# Patient Record
Sex: Male | Born: 1995 | Race: Asian | Hispanic: No | Marital: Married | Smoking: Former smoker
Health system: Southern US, Community
[De-identification: ages and names within clinical notes are randomized; demographics above are authoritative.]

---

## 2020-09-27 ENCOUNTER — Ambulatory Visit (INDEPENDENT_AMBULATORY_CARE_PROVIDER_SITE_OTHER): Payer: 59 | Admitting: Student

## 2020-09-27 ENCOUNTER — Encounter: Payer: Self-pay | Admitting: Student

## 2020-09-27 VITALS — BP 139/93 | HR 82 | Temp 98.1°F | Ht 67.0 in | Wt 195.6 lb

## 2020-09-27 DIAGNOSIS — G44211 Episodic tension-type headache, intractable: Secondary | ICD-10-CM

## 2020-09-27 DIAGNOSIS — G44219 Episodic tension-type headache, not intractable: Secondary | ICD-10-CM | POA: Diagnosis not present

## 2020-09-27 NOTE — Assessment & Plan Note (Addendum)
Patient presents today endorsing worsening headaches over the last month. He describes these headaches as throbbing, usually lasting around 30 minutes. Notes the pain does not occur in one specific spot, but around his head. He reports he has experienced these headaches intermittently for the past six years, usually occurring only ~2 times per week. He states they have now occured up to 3-4 times per day over the last month. Mentions he takes Advil to alleviate the pain, usually up to 3-4 times daily. He has taken Motrin and Tylenol as well during this time. He previously was only taking the medication only 1-2 times daily. He does mention the headaches are sometimes associated with dizziness. Otherwise, patient denies aura, blurry vision, floaters, photophobia, epiphora, rhinorrhea, chest pain, dyspnea, palpitations, syncope, abdominal pain, nausea, vomiting, fevers, extremity weakness. He states he snores intermittently, denies daytime sleepiness or frequent nighttime wakening. Of note, he and his wife recently had a baby on November 5th of this year. He states he has been able to get 7-8 hours asleep and keeps himself hydrated. He works at a [employer] features editor.   A/P: On exam, BP 132/81, repeat 139/81. No focal neurological deficit. History most consistent with tension-type headaches. No red flag symptoms. Believe these headaches are likely due to overuse of medication. Differential also includes hypertension, stress-induced, noise-induced. Given his age and frequency of headaches, will obtain imaging to rule out structural abnormalities. Plan on BMP today given frequent use of NSAIDs. Discussed with patient to gradually decrease medication use over the next two weeks. If decreasing medication does not alleviate the headaches, will refer to headache clinic for further evaluation. - Decrease NSAID use - BMP today - MRI brain ordered - F/u in two weeks, can refer to HA clinic if no relief  ADDENDUM: Unable  to reach patient by telephone today (12/14). Given his frequency of headaches, will go ahead and place referral for the headache clinic. Will attempt to reach patient again.

## 2020-09-27 NOTE — Patient Instructions (Signed)
Mr. Jeremy Petersen,  It was a pleasure seeing you today!  We discussed your headaches today. Your headaches are typical of a tension headache. We believe the frequency of your headaches is due to the medication you have been taking. We would like for you to gradually decrease the amount of Advil you are taking over the next two weeks. We are also going to get imaging of your head to rule out any structural problems. I would like to see you back in two weeks. If your headaches have not improved, we will refer you to our headache clinic.  We look forward to seeing you next time. Please call our clinic at [phone] if you have any questions or concerns. The best time to call is Monday-Friday from 9am-4pm, but there is someone available 24/7 at the same number. If you need medication refills, please notify your pharmacy one week in advance and they will send [place] a request.  Thank you for letting us take part in your care. Wishing you the best!  Thank you, Dr. Evlyn Kanner, MD

## 2020-09-27 NOTE — Progress Notes (Signed)
   CC: headaches  HPI:  [name] is a 24 y.o. without significant medical history presenting to clinic today for evaluation of worsening headaches.  Please see problem-based list for further details.  No past medical history per patient.  Review of Systems:  As per HPI  Physical Exam:  Vitals:   09/27/20 1524 09/27/20 1556  BP: 132/81 (!) 139/93  Pulse: 93 82  Temp: 98.1 F (36.7 C)   TempSrc: Oral   SpO2: 97%   Weight: 195 lb 9.6 oz (88.7 kg)   Height: 5\' 7"  (1.702 m)    General: Pleasant, no acute distress CV: Regular rate, rhythm. No murmurs, rubs, gallops Pulm: Clear to auscultation bilaterally. No wheezing, rales, rhonchi Neuro: Awake, alert, oriented x4. CN intact. Sensation intact. Motor 5/5 throughout.  Assessment & Plan:   See Encounters Tab for problem based charting.  Patient seen with Dr. 

## 2020-09-28 LAB — BMP8+ANION GAP
Anion Gap: 13 mmol/L (ref 10.0–18.0)
BUN/Creatinine Ratio: 18 (ref 9–20)
BUN: 21 mg/dL — ABNORMAL HIGH (ref 6–20)
CO2: 26 mmol/L (ref 20–29)
Calcium: 10.1 mg/dL (ref 8.7–10.2)
Chloride: 101 mmol/L (ref 96–106)
Creatinine, Ser: 1.2 mg/dL (ref 0.76–1.27)
GFR calc Af Amer: 97 mL/min/{1.73_m2} (ref >59)
GFR calc non Af Amer: 84 mL/min/{1.73_m2} (ref >59)
Glucose: 77 mg/dL (ref 65–99)
Potassium: 4.5 mmol/L (ref 3.5–5.2)
Sodium: 140 mmol/L (ref 134–144)

## 2020-09-28 NOTE — Addendum Note (Signed)
Addended byEvlyn Kanner on: 09/28/2020 04:34 PM   Modules accepted: Orders

## 2020-09-28 NOTE — Progress Notes (Signed)
Internal Medicine Clinic Attending  I saw and evaluated the patient.  I personally confirmed the key portions of the history and exam documented by Dr. Braswell and I reviewed pertinent patient test results.  The assessment, diagnosis, and plan were formulated together and I agree with the documentation in the resident's note.  

## 2020-10-29 ENCOUNTER — Ambulatory Visit (HOSPITAL_COMMUNITY)
Admission: RE | Admit: 2020-10-29 | Discharge: 2020-10-29 | Disposition: A | Discharge status: Home / Self Care | Payer: 59 | Source: Ambulatory Visit | Attending: Student in an Organized Health Care Education/Training Program | Admitting: Student in an Organized Health Care Education/Training Program

## 2020-10-29 NOTE — Progress Notes (Signed)
Patient was scheduled for MRI Brain w/o today at 12pm. Patient was a no call no show for the appointment. Order will be placed back into ancillary orders for rescheduling when needed.

## 2020-11-18 ENCOUNTER — Ambulatory Visit (HOSPITAL_COMMUNITY): Admission: RE | Admit: 2020-11-18 | Payer: 59 | Source: Ambulatory Visit

## 2020-12-02 ENCOUNTER — Ambulatory Visit (INDEPENDENT_AMBULATORY_CARE_PROVIDER_SITE_OTHER): Payer: 59

## 2020-12-02 ENCOUNTER — Ambulatory Visit (INDEPENDENT_AMBULATORY_CARE_PROVIDER_SITE_OTHER): Payer: 59 | Admitting: Surgery

## 2020-12-02 ENCOUNTER — Encounter: Payer: Self-pay | Admitting: Surgery

## 2020-12-02 VITALS — BP 127/75 | HR 102 | Ht 67.0 in | Wt 195.6 lb

## 2020-12-02 DIAGNOSIS — M545 Low back pain, unspecified: Secondary | ICD-10-CM | POA: Diagnosis not present

## 2020-12-02 MED ORDER — METHYLPREDNISOLONE 4 MG PO TABS: ORAL_TABLET | ORAL | 0 refills | Status: AC

## 2020-12-02 MED ORDER — METHOCARBAMOL 500 MG PO TABS: 500.0000 mg | ORAL_TABLET | Freq: Three times a day (TID) | ORAL | 0 refills | Status: AC | PRN
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
## 2020-12-23 ENCOUNTER — Ambulatory Visit: Payer: 59 | Admitting: Surgery

## 2020-12-23 ENCOUNTER — Encounter: Payer: Self-pay | Admitting: Surgery

## 2020-12-23 ENCOUNTER — Ambulatory Visit (INDEPENDENT_AMBULATORY_CARE_PROVIDER_SITE_OTHER): Payer: 59 | Admitting: Surgery

## 2020-12-23 ENCOUNTER — Other Ambulatory Visit: Payer: Self-pay

## 2020-12-23 VITALS — BP 125/73 | HR 78 | Ht 67.0 in | Wt 195.6 lb

## 2020-12-23 DIAGNOSIS — G8929 Other chronic pain: Secondary | ICD-10-CM | POA: Diagnosis not present

## 2020-12-23 DIAGNOSIS — M545 Low back pain, unspecified: Secondary | ICD-10-CM

## 2020-12-23 DIAGNOSIS — M4316 Spondylolisthesis, lumbar region: Secondary | ICD-10-CM | POA: Diagnosis not present

## 2020-12-23 NOTE — Progress Notes (Signed)
   Office Visit Note   Patient: Jeremy Petersen           Date of Birth: [birth year]           MRN: [id number] Visit Date: 12/23/2020              Requested by: Evlyn Kanner, MD [address] [place],  [place] [address] PCP: Evlyn Kanner, MD   Assessment & Plan: Visit Diagnoses:  1. Chronic bilateral low back pain without sciatica     Plan: Since patient has failed conservative treatment at this point I recommend getting lumbar MRI and to rule out HNP/stenosis.  He did have some L4 retrolisthesis seen on previous x-ray.  We will have him follow-up with Dr. Ophelia Charter after completion to discuss results and further treatment options.  Follow-Up Instructions: Return in about 3 weeks (around 01/13/2021) for With Dr. Ophelia Charter to review lumbar MRI scan.   Orders:  No orders of the defined types were placed in this encounter.  No orders of the defined types were placed in this encounter.     Procedures: No procedures performed   Clinical Data: No additional findings.   Subjective: Chief Complaint  Patient presents with  . Lower Back - Follow-up    HPI 25 year old male with acute on chronic low back pain returns recheck.  States that the prednisone taper did not give any improvement of his pain.  Physical therapy in our office has been backed up seeing patients and unfortunately he has not been able to get in with them yet.  He states that he has a friend that is a Advice worker who recommended some exercises for his low back but these have not helped.  He feels like since his injury that his back pain is gotten worse. Review of Systems No current cardiac pulmonary GI GU issues  Objective: Vital Signs: BP 125/73   Pulse 78   Ht 5\' 7"  (1.702 m)   Wt 195 lb 9.6 oz (88.7 kg)   BMI 30.64 kg/m   Physical Exam Constitutional:      Appearance: Normal appearance.  HENT:     Head: Normocephalic.  Pulmonary:     Effort: No respiratory distress.  Musculoskeletal:     Comments:  Patient has mild lumbar paraspinal tenderness.  Negative log about his.  Negative straight leg raise.  Patient does have tight hamstrings bilaterally.  No focal motor deficits.  Neurological:     Mental Status: He is alert and oriented to person, place, and time.     Ortho Exam  Specialty Comments:  No specialty comments available.  Imaging: No results found.   PMFS History: Patient Active Problem List   Diagnosis Date Noted  . Episodic tension type headache 09/27/2020   History reviewed. No pertinent past medical history.  History reviewed. No pertinent family history.  History reviewed. No pertinent surgical history. Social History   Occupational History  . Not on file  Tobacco Use  . Smoking status: Former Smoker    Types: E-cigarettes, Cigarettes  . Smokeless tobacco: Not on file  Substance and Sexual Activity  . Alcohol use: Not Currently  . Drug use: Never  . Sexual activity: Not on file

## 2020-12-27 ENCOUNTER — Ambulatory Visit: Payer: 59 | Attending: Surgery | Admitting: Physical Therapy

## 2021-01-10 ENCOUNTER — Ambulatory Visit: Payer: 59 | Admitting: Physical Therapy

## 2021-08-23 ENCOUNTER — Other Ambulatory Visit: Payer: Self-pay

## 2021-08-23 ENCOUNTER — Ambulatory Visit (INDEPENDENT_AMBULATORY_CARE_PROVIDER_SITE_OTHER): Payer: 59 | Admitting: Student

## 2021-08-23 ENCOUNTER — Ambulatory Visit (HOSPITAL_COMMUNITY)
Admission: RE | Admit: 2021-08-23 | Discharge: 2021-08-23 | Disposition: A | Discharge status: Home / Self Care | Payer: 59 | Source: Ambulatory Visit | Attending: Internal Medicine | Admitting: Internal Medicine

## 2021-08-23 VITALS — BP 125/81 | HR 70 | Temp 98.4°F | Wt 202.8 lb

## 2021-08-23 DIAGNOSIS — M549 Dorsalgia, unspecified: Secondary | ICD-10-CM

## 2021-08-23 DIAGNOSIS — J351 Hypertrophy of tonsils: Secondary | ICD-10-CM

## 2021-08-23 DIAGNOSIS — M546 Pain in thoracic spine: Secondary | ICD-10-CM | POA: Insufficient documentation

## 2021-08-23 DIAGNOSIS — G8929 Other chronic pain: Secondary | ICD-10-CM

## 2021-08-23 NOTE — Patient Instructions (Addendum)
Mr.Keyion Fluegge, it was a pleasure seeing you today!  Today we discussed: - Tonsils: I have referred you to ENT for your tonsils.  - Back pain: We are going to start out with other X-Ray's of your back. After that, we can move toward getting a MRI  Follow-up: 6 months   Please make sure to arrive 15 minutes prior to your next appointment. If you arrive late, you may be asked to reschedule.   We look forward to seeing you next time. Please call our clinic at [phone] if you have any questions or concerns. The best time to call is Monday-Friday from 9am-4pm, but there is someone available 24/7. If after hours or the weekend, call the main hospital number and ask for the Internal Medicine Resident On-Call. If you need medication refills, please notify your pharmacy one week in advance and they will send [place] a request.  Thank you for letting us take part in your care. Wishing you the best!  Thank you, Evlyn Kanner, MD

## 2021-08-24 DIAGNOSIS — J351 Hypertrophy of tonsils: Secondary | ICD-10-CM | POA: Insufficient documentation

## 2021-08-24 DIAGNOSIS — M549 Dorsalgia, unspecified: Secondary | ICD-10-CM | POA: Insufficient documentation

## 2021-08-24 NOTE — Progress Notes (Signed)
   CC: back pain  HPI:  [name] is a 25 y.o. with no significant medical history presenting to [facility]   No past medical history on file.  Review of Systems:  As per HPI  Physical Exam:  Vitals:   08/23/21 1607  BP: 125/81  Pulse: 70  Temp: 98.4 F (36.9 C)  TempSrc: Oral  SpO2: 100%  Weight: 202 lb 12.8 oz (92 kg)   General: Resting comfortable in chair in no acute distress HENT: Normocephalic, atraumatic. No lymphadenopathy appreciated. Severe tonsillar hypertrophy present. No exudates or bleeding appreciated in oral cavity. CV: Regular rate, rhythm. No murmurs appreciated. Pulm: Normal work of breathing on room air. Clear to auscultation bilaterally. MSK: Normal bulk, tone. No bony spinal or paraspinal tenderness. Mild tenderness to palpation on sub-scapular region bilaterally. Normal hip, neck ROM. Neuro: Awake, alert, conversing appropriately. Motor 5/5 throughout, sensation in tact throughout. No focal deficits. Psych: Normal mood, affect, speech.  Assessment & Plan:   See Encounters Tab for problem based charting.  Patient discussed with Dr.  Sol Blazing
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
## 2021-08-24 NOTE — Assessment & Plan Note (Signed)
Patient reports history of hypertrophic tonsils since childhood. He previously saw an ENT physician but deferred surgery at the time. More recently, patient reports his tonsils have been giving him more problems. Describes feeling as though food sticks in his throat easily. In addition, he endorses difficulty coughing up phlegm when he was previously sick. He states he would be amenable to surgical intervention now.  - ENT referral placed

## 2021-08-25 ENCOUNTER — Telehealth: Payer: Self-pay | Admitting: Student

## 2021-08-25 ENCOUNTER — Encounter: Payer: 59 | Admitting: Student

## 2021-08-25 ENCOUNTER — Other Ambulatory Visit: Payer: Self-pay | Admitting: Student

## 2021-08-25 DIAGNOSIS — G8929 Other chronic pain: Secondary | ICD-10-CM

## 2021-08-25 NOTE — Progress Notes (Signed)
Internal Medicine Clinic Attending ? ?Case discussed with Dr. Braswell  at the time of the visit.  We reviewed the resident?s history and exam and pertinent patient test results.  I agree with the assessment, diagnosis, and plan of care documented in the resident?s note.  ?

## 2021-08-25 NOTE — Telephone Encounter (Signed)
Pt rtn call to Dr. Marijo Conception about his Test results.

## 2021-09-29 ENCOUNTER — Telehealth: Payer: Self-pay

## 2021-09-29 NOTE — Telephone Encounter (Signed)
Error

## 2021-11-01 ENCOUNTER — Encounter: Payer: Self-pay | Admitting: Student

## 2021-11-14 ENCOUNTER — Ambulatory Visit (INDEPENDENT_AMBULATORY_CARE_PROVIDER_SITE_OTHER): Payer: 59 | Admitting: Student

## 2021-11-14 VITALS — BP 127/78 | HR 77 | Temp 98.1°F | Ht 67.0 in | Wt 207.3 lb

## 2021-11-14 DIAGNOSIS — M549 Dorsalgia, unspecified: Secondary | ICD-10-CM

## 2021-11-14 DIAGNOSIS — G8929 Other chronic pain: Secondary | ICD-10-CM | POA: Diagnosis not present

## 2021-11-14 NOTE — Patient Instructions (Signed)
It was a pleasure seeing you in clinic. Today we discussed:   Back pain Continue tylenol and ibuprofen, you can continue using lidocaine patches, and heating pad as well for pain I will send a referral to Physical therapy, they may be able to show you exercises to help with the pain We will send request for imaging to your new insurance, please follow up after you get imaging   If you have any questions or concerns, please call our clinic at [phone] between 9am-5pm and after hours call [phone] and ask for the internal medicine resident on call. If you feel you are having a medical emergency please call 911.   Thank you, we look forward to helping you remain healthy!

## 2021-11-15 NOTE — Assessment & Plan Note (Signed)
Patient presents for follow up of back pain. States pain has worsened in that it is more frequent. Pain occurs throughout the day. Stretching back helps temporarily but pain returns within half an hour. Describes pain as a stabbing aching pain similar to having worked out the previous day. Notes he has had to call off work multiple times due to pain. Tylenol and Ibuprofen help with the pain. Denies fever, chills, weight loss, numbness, tingling, or incontinence. He has tried massage in the past which helped for about a day. Does not notice much pattern in when the pain occurs. 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  Discussed physical therapy which the he is interested in, but concerned that he will be out of town for work in 2 weeks and hopes to ave this arranged. Previously had [employer] health insurance that rejected MRI of the cervical, lumbar, and thoracic spine. Currently has [employer] which he brought card to to visit today.   Plan Tylenol and ibuprofen as needed for pain, offered muscle relaxer but he is concerned ti will make him sleepy -Referral to PT, hope to get him in before he need to travel for work -Will discuss with referral coordinator to submit paper work for MRI  Of the cervical, thoracic, and lumar spine

## 2021-11-15 NOTE — Progress Notes (Signed)
° °  CC: follow up of back pain  HPI:  [name] is a 26 y.o. male who is generally healthy presents for follow up of back pain for the past 1-2 years. Pain has become more frequent and bothersome. Please refer to problem based charting for further details and assessment and plan of current problem and chronic medical conditions.   No past medical history on file. Review of Systems:  Negative as per HPI  Physical Exam:  Vitals:   11/14/21 1431  BP: 127/78  Pulse: 77  Temp: 98.1 F (36.7 C)  TempSrc: Oral  SpO2: 100%  Weight: 207 lb 4.8 oz (94 kg)  Height: 5\' 7"  (1.702 m)   Physical Exam Constitutional:      Appearance: Normal appearance.  HENT:     Mouth/Throat:     Mouth: Mucous membranes are moist.     Pharynx: Oropharynx is clear.  Cardiovascular:     Rate and Rhythm: Normal rate and regular rhythm.  Pulmonary:     Effort: Pulmonary effort is normal.     Breath sounds: No rhonchi or rales.  Abdominal:     General: Abdomen is flat. Bowel sounds are normal. There is no distension.     Palpations: Abdomen is soft.     Tenderness: There is no abdominal tenderness.  Musculoskeletal:     Right lower leg: No edema.     Left lower leg: No edema.     Comments: TTP of the right thoracic back under shoulder blades, no redness, no midline tenderness, no step off, ROM restricted by pain  Skin:    General: Skin is warm and dry.     Capillary Refill: Capillary refill takes less than 2 seconds.  Neurological:     General: No focal deficit present.     Mental Status: He is alert and oriented to person, place, and time.  Psychiatric:        Mood and Affect: Mood normal.        Behavior: Behavior normal.     Assessment & Plan:   See Encounters Tab for problem based charting.  Patient discussed with Dr. Dareen Piano

## 2021-11-16 NOTE — Progress Notes (Signed)
Internal Medicine Clinic Attending ? ?Case discussed with Dr. Liang  At the time of the visit.  We reviewed the resident?s history and exam and pertinent patient test results.  I agree with the assessment, diagnosis, and plan of care documented in the resident?s note. ? ?

## 2021-11-20 ENCOUNTER — Ambulatory Visit (HOSPITAL_COMMUNITY)
Admission: RE | Admit: 2021-11-20 | Discharge: 2021-11-20 | Disposition: A | Discharge status: Home / Self Care | Payer: 59 | Source: Ambulatory Visit | Attending: Internal Medicine | Admitting: Internal Medicine

## 2021-11-20 DIAGNOSIS — M549 Dorsalgia, unspecified: Secondary | ICD-10-CM | POA: Insufficient documentation

## 2021-11-20 DIAGNOSIS — G8929 Other chronic pain: Secondary | ICD-10-CM

## 2021-11-21 ENCOUNTER — Encounter: Payer: Self-pay | Admitting: Student

## 2021-11-22 ENCOUNTER — Telehealth: Payer: Self-pay

## 2021-11-22 NOTE — Telephone Encounter (Signed)
Pt is requesting a call back .. he stated no one ever called him with his mri results

## 2021-11-23 NOTE — Telephone Encounter (Signed)
Attempted to call patient with imaging results but call went to VM. Left HIPPA compliant VM.  Imaging with mild disc disease but no nerve impingment, spinal stenosis, or bony abnormalities. Suspect pains ins MSK in nature and set up with PT if not already. If pain worsening or not improving with PT will need clinic follow up for further evaluation. Will attempt to call patient with results later today.

## 2021-11-28 ENCOUNTER — Telehealth: Payer: Self-pay

## 2021-11-28 NOTE — Telephone Encounter (Signed)
Please call pt back about test results.

## 2021-11-29 NOTE — Therapy (Incomplete)
OUTPATIENT PHYSICAL THERAPY THORACOLUMBAR EVALUATION   Patient Name: Jeremy Petersen MRN: [id number] DOB:12/11/1995, 26 y.o., male Today's Date: 11/29/2021    No past medical history on file. No past surgical history on file. Patient Active Problem List   Diagnosis Date Noted   Tonsillar hypertrophy 08/24/2021   Back pain 08/24/2021   Episodic tension type headache 09/27/2020    PCP: Evlyn Kanner, MD  REFERRING PROVIDER: Reymundo Poll, MD  REFERRING DIAG: [phone] (ICD-10-CM) - Other chronic back pain   THERAPY DIAG:  No diagnosis found.  ONSET DATE: ***  SUBJECTIVE:                                                                                                                                                                                           SUBJECTIVE STATEMENT: *** PERTINENT HISTORY:  ***  PAIN:  Are you having pain? {yes/no:20286} NPRS scale: ***/10 Pain location: *** Pain orientation: {Pain Orientation:25161}  PAIN TYPE: {type:313116} Pain description: {PAIN DESCRIPTION:21022940}  Aggravating factors: *** Relieving factors: ***  PRECAUTIONS: None  WEIGHT BEARING RESTRICTIONS No  FALLS:  Has patient fallen in last 6 months? {yes/no:20286}, Number of falls: ***  LIVING ENVIRONMENT: Lives with: {[facility] lives with:25569::"lives with their family"} Lives in: {Lives in:25570} Stairs: {yes/no:20286}; {Stairs:24000} Has following equipment at home: {Assistive devices:23999}  OCCUPATION: ***  PLOF: {PLOF:24004}  PATIENT GOALS ***   OBJECTIVE:   DIAGNOSTIC FINDINGS:  IMPRESSION: 1. At L4-5 there is a mild broad-based disc bulge with a small central disc protrusion. No foraminal or central canal stenosis. 2.  No acute osseous injury of the lumbar spine.  IMPRESSION: 1. At T3-4 there is a small shallow left paracentral disc protrusion. 2. At T7-8 there is a small right paracentral disc protrusion. 3. No evidence of nerve root  impingement. 4. No acute osseous injury of the thoracic spine.    IMPRESSION: 1. At C4-5 there is a tiny right paracentral disc protrusion. No foraminal or central canal stenosis. No evidence of nerve root impingement. 2.  No acute osseous injury of the cervical spine. 3. Enlarged lingual tonsils which are nonspecific in appearance and may reflect lingual tonsillar hypertrophy, but other pathology is not excluded. Recommend ENT consultation.    PATIENT SURVEYS:  FOTO ***  SCREENING FOR RED FLAGS: Bowel or bladder incontinence: {Yes/No:304960894} Cauda equina syndrome: {Yes/No:304960894}   COGNITION:  Overall cognitive status: {cognition:24006}     SENSATION:  Light touch: {intact/deficits:24005}    MUSCLE LENGTH: Hamstrings: Right *** deg; Left *** deg Thomas test: Right *** deg; Left *** deg  POSTURE:  ***  PALPATION: ***  LUMBARAROM/PROM  A/PROM A/PROM  11/29/2021  Flexion   Extension   Right lateral flexion   Left lateral flexion   Right rotation   Left rotation    (Blank rows = not tested)  LE AROM/PROM:  A/PROM Right 11/29/2021 Left 11/29/2021  Hip flexion    Hip extension    Hip abduction    Hip adduction    Hip internal rotation    Hip external rotation    Knee flexion    Knee extension    Ankle dorsiflexion    Ankle plantarflexion    Ankle inversion    Ankle eversion     (Blank rows = not tested)  LE MMT:  MMT Right 11/29/2021 Left 11/29/2021  Hip flexion    Hip extension    Hip abduction    Hip adduction    Hip internal rotation    Hip external rotation    Knee flexion    Knee extension    Ankle dorsiflexion    Ankle plantarflexion    Ankle inversion    Ankle eversion     (Blank rows = not tested)  LUMBAR SPECIAL TESTS:    FUNCTIONAL TESTS:  {Functional tests:24029}  GAIT: Distance walked: *** Assistive device utilized: {Assistive devices:23999} Level of assistance: {Levels of assistance:24026} Comments:  ***    TODAY'S TREATMENT  [facility] Adult PT Treatment:                                                DATE: 11/30/21 Therapeutic Exercise: Demonstrated and issued initial HEP.   Therapeutic Activity: Education on assessment findings that will be addressed throughout duration of POC.       PATIENT EDUCATION:  Education details: see treatment above  Person educated: Patient Education method: Explanation, Demonstration, Tactile cues, Verbal cues, and Handouts Education comprehension: verbalized understanding, returned demonstration, verbal cues required, tactile cues required, and needs further education   HOME EXERCISE PROGRAM: ***  ASSESSMENT:  CLINICAL IMPRESSION: Patient is a 26 y.o. male who was seen today for physical therapy evaluation and treatment for ***. Objective impairments include {opptimpairments:25111}. These impairments are limiting patient from {activity limitations:25113}. Personal factors including {Personal factors:25162} are also affecting patient's functional outcome. Patient will benefit from skilled PT to address above impairments and improve overall function.  REHAB POTENTIAL: {rehabpotential:25112}  CLINICAL DECISION MAKING: {clinical decision making:25114}  EVALUATION COMPLEXITY: {Evaluation complexity:25115}   GOALS: Goals reviewed with patient? No  SHORT TERM GOALS:  STG Name Target Date Goal status  1 *** Baseline:  {follow up:25551} INITIAL  2 *** Baseline:  {follow up:25551} INITIAL  3 *** Baseline: {follow up:25551} INITIAL   LONG TERM GOALS:   LTG Name Target Date Goal status  1 *** Baseline: {follow up:25551} INITIAL  2 *** Baseline: {follow up:25551} INITIAL  3 *** Baseline: {follow up:25551} INITIAL  4 *** Baseline: {follow up:25551} INITIAL   PLAN: PT FREQUENCY: {rehab frequency:25116}  PT DURATION: {rehab duration:25117}  PLANNED INTERVENTIONS: {rehab planned interventions:25118::"Therapeutic exercises","Therapeutic  activity","Neuro Muscular re-education","Balance training","Gait training","Patient/Family education","Joint mobilization"}  PLAN FOR NEXT SESSION: ***   Derald Macleod, PT 11/29/2021, 10:39 AM

## 2021-11-29 NOTE — Telephone Encounter (Signed)
Called patient to discuss MRI results. No significant finding on MRI to explain his back pain. I do feel that his pain may be from MSK injury. He need to call and reschedule his PT appointment. If pain not improving or it worsening he can call to schedule a follow up. Patient agreeable to this, Also note hs is traveling to [place] for work in about 2 weeks and inquiring about vaccines. Recommend he follow with [facility] travel clinic to discuss this further, unfortunately he did not pick when I called him about this, but left a VM with contact information for the health department.

## 2021-11-30 ENCOUNTER — Telehealth: Payer: Self-pay

## 2021-11-30 ENCOUNTER — Ambulatory Visit: Payer: 59 | Attending: Internal Medicine

## 2021-11-30 NOTE — Telephone Encounter (Signed)
Pt is requesting a call back from his Dr about his MRI results .Marland Kitchen I explained to him he already has an appt with Dr Kirke Corin 2/23 for the same thing he stated he will keep the appt but still wants Dr Marijo Conception t give him a call

## 2021-12-02 ENCOUNTER — Telehealth: Payer: Self-pay | Admitting: Student

## 2021-12-02 MED ORDER — ATOVAQUONE-PROGUANIL HCL 250-100 MG PO TABS: 1.0000 | ORAL_TABLET | Freq: Every day | ORAL | 0 refills | Status: AC

## 2021-12-02 NOTE — Telephone Encounter (Signed)
Called to discuss recent MRI results with Jeremy Petersen. During our conversation, patient requesting malaria prophylaxis for a trip to [place] in one week. Will plan to prescribe daily atovaquone-proguanil. Patient verbalized understanding for daily use, starting 1-2d prior to travel and continuing 7d after return.    Evlyn Kanner, MD Internal Medicine PGY-2 [phone]

## 2021-12-07 ENCOUNTER — Encounter (HOSPITAL_COMMUNITY): Payer: Self-pay | Admitting: Emergency Medicine

## 2021-12-07 ENCOUNTER — Ambulatory Visit (HOSPITAL_COMMUNITY)
Admission: EM | Admit: 2021-12-07 | Discharge: 2021-12-07 | Disposition: A | Discharge status: Home / Self Care | Payer: 59

## 2021-12-07 ENCOUNTER — Emergency Department (HOSPITAL_COMMUNITY): Payer: 59

## 2021-12-07 ENCOUNTER — Other Ambulatory Visit: Payer: Self-pay

## 2021-12-07 ENCOUNTER — Emergency Department (HOSPITAL_COMMUNITY)
Admission: EM | Admit: 2021-12-07 | Discharge: 2021-12-07 | Disposition: A | Discharge status: Home / Self Care | Payer: 59 | Attending: Emergency Medicine | Admitting: Emergency Medicine

## 2021-12-07 DIAGNOSIS — R1031 Right lower quadrant pain: Secondary | ICD-10-CM | POA: Insufficient documentation

## 2021-12-07 LAB — URINALYSIS, ROUTINE W REFLEX MICROSCOPIC
Bilirubin Urine: NEGATIVE
Glucose, UA: NEGATIVE mg/dL
Hgb urine dipstick: NEGATIVE
Ketones, ur: NEGATIVE mg/dL
Leukocytes,Ua: NEGATIVE
Nitrite: NEGATIVE
Protein, ur: NEGATIVE mg/dL
Specific Gravity, Urine: 1.024 (ref 1.005–1.030)
pH: 5 (ref 5.0–8.0)

## 2021-12-07 LAB — CBC
HCT: 44.8 % (ref 39.0–52.0)
Hemoglobin: 14.8 g/dL (ref 13.0–17.0)
MCH: 26.5 pg (ref 26.0–34.0)
MCHC: 33 g/dL (ref 30.0–36.0)
MCV: 80.1 fL (ref 80.0–100.0)
Platelets: 263 10*3/uL (ref 150–400)
RBC: 5.59 MIL/uL (ref 4.22–5.81)
RDW: 14.3 % (ref 11.5–15.5)
WBC: 8 10*3/uL (ref 4.0–10.5)
nRBC: 0 % (ref 0.0–0.2)

## 2021-12-07 LAB — COMPREHENSIVE METABOLIC PANEL
ALT: 37 U/L (ref 0–44)
AST: 23 U/L (ref 15–41)
Albumin: 4.2 g/dL (ref 3.5–5.0)
Alkaline Phosphatase: 40 U/L (ref 38–126)
Anion gap: 10 (ref 5–15)
BUN: 18 mg/dL (ref 6–20)
CO2: 24 mmol/L (ref 22–32)
Calcium: 9.3 mg/dL (ref 8.9–10.3)
Chloride: 104 mmol/L (ref 98–111)
Creatinine, Ser: 1.02 mg/dL (ref 0.61–1.24)
GFR, Estimated: >60 mL/min (ref >60)
Glucose, Bld: 87 mg/dL (ref 70–99)
Potassium: 3.9 mmol/L (ref 3.5–5.1)
Sodium: 138 mmol/L (ref 135–145)
Total Bilirubin: 0.3 mg/dL (ref 0.3–1.2)
Total Protein: 7.8 g/dL (ref 6.5–8.1)

## 2021-12-07 LAB — LIPASE, BLOOD: Lipase: 36 U/L (ref 11–51)

## 2021-12-07 MED ORDER — ACETAMINOPHEN 500 MG PO TABS: 1000.0000 mg | ORAL_TABLET | Freq: Once | ORAL | Status: DC

## 2021-12-07 MED ORDER — IOHEXOL 300 MG/ML  SOLN
100.0000 mL | Freq: Once | INTRAMUSCULAR | Status: AC | PRN
  Administered 2021-12-07: 100 mL via INTRAVENOUS

## 2021-12-07 NOTE — ED Provider Notes (Signed)
[facility]    CSN: [id number] Arrival date & time: 12/07/21  1801      History   Chief Complaint Chief Complaint  Patient presents with   Abdominal Pain    HPI Jeremy Petersen is a 26 y.o. male.  Patient reports right lower quadrant pain since Monday.  Pain has been persistent.  Describes pain today is more severe and a throbbing pain.  Had nausea yesterday none today, but no vomiting.  Denies diarrhea or constipation or change in bowel habits.  Denies dysuria.  Denies fever or chills.   Abdominal Pain Associated symptoms: nausea   Associated symptoms: no chills, no constipation, no diarrhea, no dysuria, no fever and no vomiting    History reviewed. No pertinent past medical history.  Patient Active Problem List   Diagnosis Date Noted   Tonsillar hypertrophy 08/24/2021   Back pain 08/24/2021   Episodic tension type headache 09/27/2020    History reviewed. No pertinent surgical history.     Home Medications    Prior to Admission medications   Medication Sig Start Date End Date Taking? Authorizing Provider  atovaquone-proguanil (MALARONE) 250-100 MG TABS tablet Take 1 tablet by mouth daily. Start taking 1 tablet daily 2 days prior to traveling. Continue taking 1 tablet daily until 7 days after you return. 12/07/21 04/06/22  Evlyn Kanner, MD  methocarbamol (ROBAXIN) 500 MG tablet Take 1 tablet (500 mg total) by mouth every 8 (eight) hours as needed for muscle spasms. 12/02/20   Naida Sleight, PA-C  methylPREDNISolone (MEDROL) 4 MG tablet 6-day taper to be taken as directed 12/02/20   Naida Sleight, PA-C    Family History No family history on file.  Social History Social History   Tobacco Use   Smoking status: Former    Types: E-cigarettes, Cigarettes  Substance Use Topics   Alcohol use: Not Currently   Drug use: Never     Allergies   Patient has no known allergies.   Review of Systems Review of Systems  Constitutional:  Negative for chills  and fever.  Gastrointestinal:  Positive for abdominal pain and nausea. Negative for constipation, diarrhea and vomiting.  Genitourinary:  Negative for dysuria.    Physical Exam Triage Vital Signs ED Triage Vitals  Enc Vitals Group     BP 12/07/21 1827 130/69     Pulse Rate 12/07/21 1827 81     Resp 12/07/21 1827 17     Temp 12/07/21 1827 98.6 F (37 C)     Temp Source 12/07/21 1827 Oral     SpO2 12/07/21 1827 96 %     Weight --      Height --      Head Circumference --      Peak Flow --      Pain Score 12/07/21 1825 8     Pain Loc --      Pain Edu? --      Excl. in GC? --    No data found.  Updated Vital Signs BP 130/69 (BP Location: Left Arm)    Pulse 81    Temp 98.6 F (37 C) (Oral)    Resp 17    SpO2 96%   Visual Acuity Right Eye Distance:   Left Eye Distance:   Bilateral Distance:    Right Eye Near:   Left Eye Near:    Bilateral Near:     Physical Exam Constitutional:      General: He is not in acute distress.  Appearance: He is well-developed. He is not ill-appearing.  Cardiovascular:     Rate and Rhythm: Normal rate and regular rhythm.  Pulmonary:     Effort: Pulmonary effort is normal.     Breath sounds: Normal breath sounds.  Abdominal:     General: Abdomen is flat. Bowel sounds are decreased.     Palpations: Abdomen is soft.     Tenderness: There is abdominal tenderness in the right lower quadrant. There is rebound.  Neurological:     Mental Status: He is alert.     UC Treatments / Results  Labs (all labs ordered are listed, but only abnormal results are displayed) Labs Reviewed - No data to display  EKG   Radiology No results found.  Procedures Procedures (including critical care time)  Medications Ordered in UC Medications - No data to display  Initial Impression / Assessment and Plan / UC Course  I have reviewed the triage vital signs and the nursing notes.  Pertinent labs & imaging results that were available during my care  of the patient were reviewed by me and considered in my medical decision making (see chart for details).    Patient will need further eval in the ER for right lower quadrant pain.  Final Clinical Impressions(s) / UC Diagnoses   Final diagnoses:  Right lower quadrant abdominal pain     Discharge Instructions      Please go to the ER for further care.    ED Prescriptions   None    PDMP not reviewed this encounter.   Cathlyn Parsons, NP 12/07/21 [phone]

## 2021-12-07 NOTE — Discharge Instructions (Addendum)
It was our pleasure to provide your ER care today - we hope that you feel better.  Drink plenty of fluids/stay well hydrated.  Take acetaminophen or ibuprofen as need. F  Follow up with primary care doctor in 2-3 days if symptoms fail to improve/resolve.  Return to ER if worse, new symptoms, fevers, new, worsening or severe pain, persistent vomiting, or other concern.

## 2021-12-07 NOTE — ED Notes (Signed)
Patient is being discharged from the Urgent Care and sent to the Emergency Department via POV . Per A Kabbe, PA, patient is in need of higher level of care due to severe abdominal pain. Patient is aware and verbalizes understanding of plan of care.  Vitals:   12/07/21 1827  BP: 130/69  Pulse: 81  Resp: 17  Temp: 98.6 F (37 C)  SpO2: 96%

## 2021-12-07 NOTE — ED Triage Notes (Signed)
Patient reports persistent RLQ abdominal pain this week with mild nausea , no emesis or diarrhea , denies fever or chills .

## 2021-12-07 NOTE — Discharge Instructions (Signed)
Please go to the ER for further care.  °

## 2021-12-07 NOTE — ED Triage Notes (Signed)
Pt reports had RLQ pains since Monday. Pt reports movement or palpation makes pain worse. Yesterday felt nauseated. Denies vomiting, urinary or bowel problems.

## 2021-12-07 NOTE — ED Provider Notes (Signed)
[facility] EMERGENCY DEPARTMENT Provider Note   CSN: [id number] Arrival date & time: 12/07/21  1906     History  Chief Complaint  Patient presents with   Abdominal Pain    Jeremy Petersen is a 26 y.o. male.  Patient c/o rlq abd pain in the past two days. Symptoms acute onset, moderate, persistent, slowly worse, dull, non radiating. More or less normal appetite. No vomiting. No diarrhea. No abd distension. No hx same pain. No prior abd surgery. No back/flank pain. No scrotal or testicular pain or swelling. No dysuria or hematuria. No trauma/fall. Went to urgent care and referred to ED. No fever.   The history is provided by the patient and medical records.  Abdominal Pain Associated symptoms: no chest pain, no cough, no dysuria, no fever, no sore throat and no vomiting       Home Medications Prior to Admission medications   Medication Sig Start Date End Date Taking? Authorizing Provider  atovaquone-proguanil (MALARONE) 250-100 MG TABS tablet Take 1 tablet by mouth daily. Start taking 1 tablet daily 2 days prior to traveling. Continue taking 1 tablet daily until 7 days after you return. 12/07/21 04/06/22  Sanjuan Dame, MD  methocarbamol (ROBAXIN) 500 MG tablet Take 1 tablet (500 mg total) by mouth every 8 (eight) hours as needed for muscle spasms. 12/02/20   Lanae Crumbly, PA-C  methylPREDNISolone (MEDROL) 4 MG tablet 6-day taper to be taken as directed 12/02/20   Lanae Crumbly, PA-C      Allergies    Patient has no known allergies.    Review of Systems   Review of Systems  Constitutional:  Negative for fever.  HENT:  Negative for sore throat.   Respiratory:  Negative for cough.   Cardiovascular:  Negative for chest pain.  Gastrointestinal:  Positive for abdominal pain. Negative for vomiting.  Genitourinary:  Negative for dysuria.  Musculoskeletal:  Negative for back pain.  Skin:  Negative for rash.  Neurological:  Negative for headaches.   Physical  Exam Updated Vital Signs BP 125/72 (BP Location: Right Arm)    Pulse 83    Temp 98.3 F (36.8 C)    Resp 16    Ht 1.702 m (5\' 7" )    Wt 102 kg    SpO2 97%    BMI 35.22 kg/m  Physical Exam Vitals and nursing note reviewed.  Constitutional:      Appearance: Normal appearance. He is well-developed.  HENT:     Head: Atraumatic.     Nose: Nose normal.     Mouth/Throat:     Mouth: Mucous membranes are moist.     Pharynx: Oropharynx is clear.  Eyes:     General: No scleral icterus.    Conjunctiva/sclera: Conjunctivae normal.  Neck:     Trachea: No tracheal deviation.  Cardiovascular:     Rate and Rhythm: Normal rate and regular rhythm.     Pulses: Normal pulses.     Heart sounds: Normal heart sounds. No murmur heard.   No friction rub. No gallop.  Pulmonary:     Effort: Pulmonary effort is normal. No accessory muscle usage or respiratory distress.     Breath sounds: Normal breath sounds.  Abdominal:     General: Bowel sounds are normal. There is no distension.     Palpations: Abdomen is soft.     Tenderness: There is abdominal tenderness. There is guarding.     Comments: Marked RLQ tenderness.   Genitourinary:  Comments: No cva tenderness.no scrotal or testicular pain, swelling, or tenderness.  Musculoskeletal:        General: No swelling.     Cervical back: Normal range of motion and neck supple. No rigidity.  Skin:    General: Skin is warm and dry.     Findings: No rash.     Comments: No skin changes, sts, induration or erythema in area of pain.   Neurological:     Mental Status: He is alert.     Comments: Alert, speech clear.   Psychiatric:        Mood and Affect: Mood normal.    ED Results / Procedures / Treatments   Labs (all labs ordered are listed, but only abnormal results are displayed) Results for orders placed or performed during the hospital encounter of 12/07/21  Lipase, blood  Result Value Ref Range   Lipase 36 11 - 51 U/L  Comprehensive metabolic  panel  Result Value Ref Range   Sodium 138 135 - 145 mmol/L   Potassium 3.9 3.5 - 5.1 mmol/L   Chloride 104 98 - 111 mmol/L   CO2 24 22 - 32 mmol/L   Glucose, Bld 87 70 - 99 mg/dL   BUN 18 6 - 20 mg/dL   Creatinine, Ser 1.02 0.61 - 1.24 mg/dL   Calcium 9.3 8.9 - 10.3 mg/dL   Total Protein 7.8 6.5 - 8.1 g/dL   Albumin 4.2 3.5 - 5.0 g/dL   AST 23 15 - 41 U/L   ALT 37 0 - 44 U/L   Alkaline Phosphatase 40 38 - 126 U/L   Total Bilirubin 0.3 0.3 - 1.2 mg/dL   GFR, Estimated >60 >60 mL/min   Anion gap 10 5 - 15  CBC  Result Value Ref Range   WBC 8.0 4.0 - 10.5 K/uL   RBC 5.59 4.22 - 5.81 MIL/uL   Hemoglobin 14.8 13.0 - 17.0 g/dL   HCT 44.8 39.0 - 52.0 %   MCV 80.1 80.0 - 100.0 fL   MCH 26.5 26.0 - 34.0 pg   MCHC 33.0 30.0 - 36.0 g/dL   RDW 14.3 11.5 - 15.5 %   Platelets 263 150 - 400 K/uL   nRBC 0.0 0.0 - 0.2 %  Urinalysis, Routine w reflex microscopic  Result Value Ref Range   Color, Urine YELLOW YELLOW   APPearance CLEAR CLEAR   Specific Gravity, Urine 1.024 1.005 - 1.030   pH 5.0 5.0 - 8.0   Glucose, UA NEGATIVE NEGATIVE mg/dL   Hgb urine dipstick NEGATIVE NEGATIVE   Bilirubin Urine NEGATIVE NEGATIVE   Ketones, ur NEGATIVE NEGATIVE mg/dL   Protein, ur NEGATIVE NEGATIVE mg/dL   Nitrite NEGATIVE NEGATIVE   Leukocytes,Ua NEGATIVE NEGATIVE   EKG None  Radiology CT Abdomen Pelvis W Contrast  Result Date: 12/07/2021 CLINICAL DATA:  Right lower quadrant pain for 2 days, initial encounter EXAM: CT ABDOMEN AND PELVIS WITH CONTRAST TECHNIQUE: Multidetector CT imaging of the abdomen and pelvis was performed using the standard protocol following bolus administration of intravenous contrast. RADIATION DOSE REDUCTION: This exam was performed according to the departmental dose-optimization program which includes automated exposure control, adjustment of the mA and/or kV according to patient size and/or use of iterative reconstruction technique. CONTRAST:  155mL OMNIPAQUE IOHEXOL 300  MG/ML  SOLN COMPARISON:  None. FINDINGS: Lower chest: No acute abnormality. Hepatobiliary: No focal liver abnormality is seen. No gallstones, gallbladder wall thickening, or biliary dilatation. Pancreas: Unremarkable. No pancreatic ductal dilatation or surrounding  inflammatory changes. Spleen: Normal in size without focal abnormality. Adrenals/Urinary Tract: Adrenal glands are within normal limits. Kidneys demonstrate a normal enhancement pattern bilaterally. No renal calculi or obstructive changes are seen. The ureters are within normal limits. The bladder is decompressed. Stomach/Bowel: Appendix is well visualized without inflammatory changes to suggest appendicitis. Colon is within normal limits. Stomach and small bowel are unremarkable. Vascular/Lymphatic: No significant vascular findings are present. No enlarged abdominal or pelvic lymph nodes. Reproductive: Prostate is unremarkable. Other: No abdominal wall hernia or abnormality. No abdominopelvic ascites. Musculoskeletal: No acute or significant osseous findings. IMPRESSION: Normal-appearing appendix. No other focal abnormality is noted. Electronically Signed   By: Inez Catalina M.D.   On: 12/07/2021 22:41    Procedures Procedures    Medications Ordered in ED Medications - No data to display  ED Course/ Medical Decision Making/ A&P                           Medical Decision Making Problems Addressed: RLQ abdominal pain: acute illness or injury  Amount and/or Complexity of Data Reviewed Independent Historian: caregiver    Details: additional hx External Data Reviewed: notes. Labs: ordered. Decision-making details documented in ED Course. Radiology: ordered and independent interpretation performed. Decision-making details documented in ED Course.  Risk OTC drugs. Prescription drug management.   Iv ns. Continuous pulse ox and cardiac monitoring. Stat labs ordered. Imaging ordered.   Reviewed nursing notes and prior charts for  additional history. Additional hx from urgent care.  External records reviewed.   Labs reviewed/interpreted by me - wbc normal.   CT reviewed/interpreted by me - no appendicitis.  Acetaminophen po. Po fluids.  Patient currently appears stable for d/c.  Return precautions provided.             Final Clinical Impression(s) / ED Diagnoses Final diagnoses:  None    Rx / DC Orders ED Discharge Orders     None         Lajean Saver, MD 12/07/21 2257

## 2021-12-07 NOTE — ED Notes (Signed)
Patient is being discharged from the Urgent Care and sent to the Emergency Department via POV with friend . Per Willeen Cass, NP, patient is in need of higher level of care due to RLQ pain. Patient is aware and verbalizes understanding of plan of care.  Vitals:   12/07/21 1827  BP: 130/69  Pulse: 81  Resp: 17  Temp: 98.6 F (37 C)  SpO2: 96%

## 2021-12-08 ENCOUNTER — Encounter: Payer: Self-pay | Admitting: Student

## 2021-12-08 ENCOUNTER — Encounter: Payer: 59 | Admitting: Student

## 2022-10-16 IMAGING — CT CT ABD-PELV W/ CM
2 of 4 series · 16 of 46 positions shown, 18 images · IV contrast (APPLIED)
Comparison: None.

CLINICAL DATA: Right lower quadrant pain for 2 days, initial
encounter

EXAM:
CT ABDOMEN AND PELVIS WITH CONTRAST
TECHNIQUE: Multidetector CT imaging of the abdomen and pelvis was performed
using the standard protocol following bolus administration of
intravenous contrast.

[Series 3: abdomen 5.0 · axial · 0.85mm/px · z∈[+543,+953]mm · 13 of 92 slices shown, 15 images]
[im 5/92  soft-tissue]
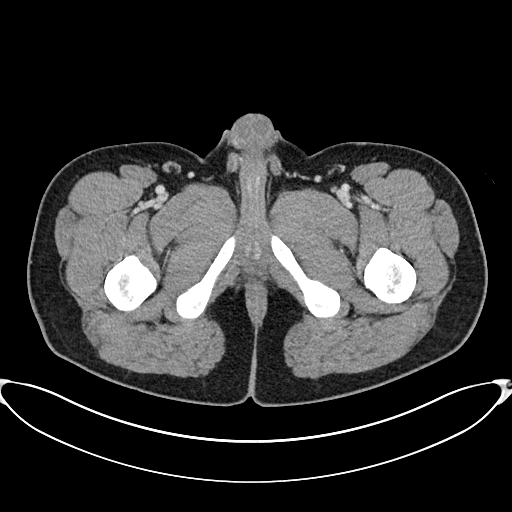
[im 5/92  bone]
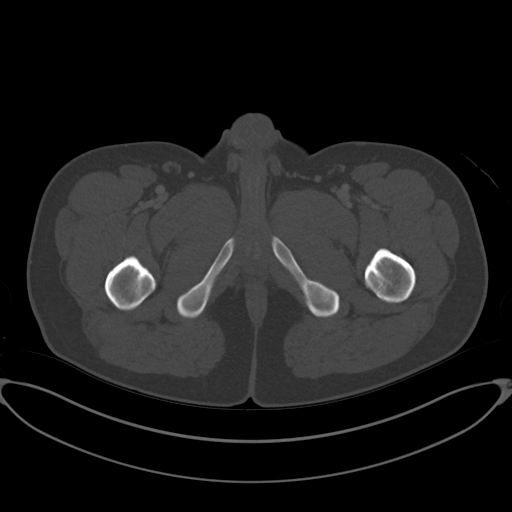
[im 14/92  soft-tissue]
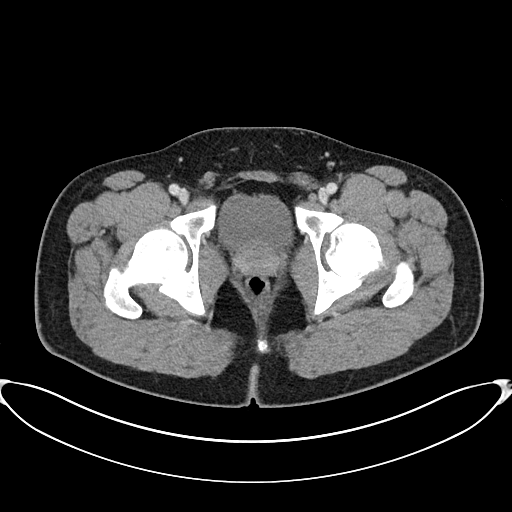
[im 19/92  soft-tissue]
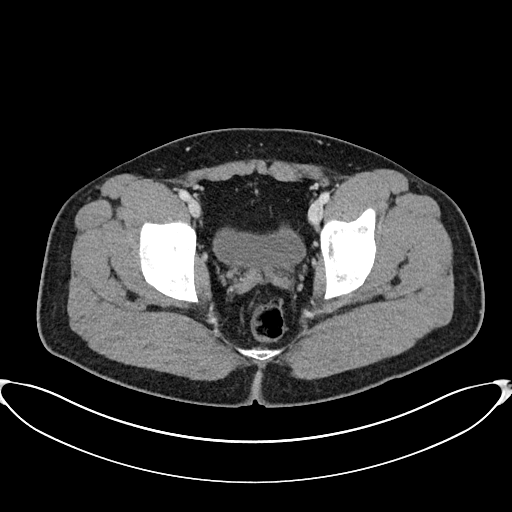
[im 28/92  soft-tissue]
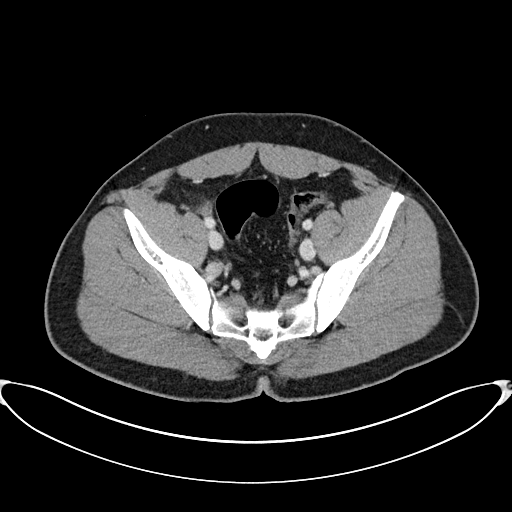
[im 32/92  soft-tissue]
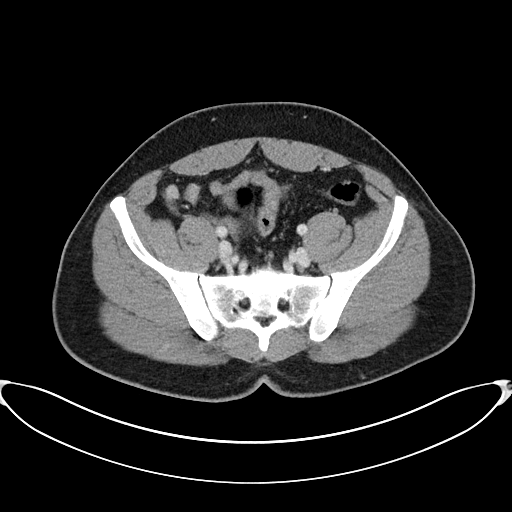
[im 41/92  soft-tissue]
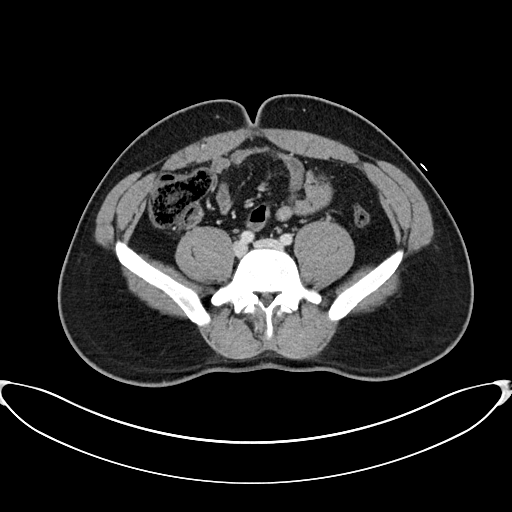
[im 46/92  soft-tissue]
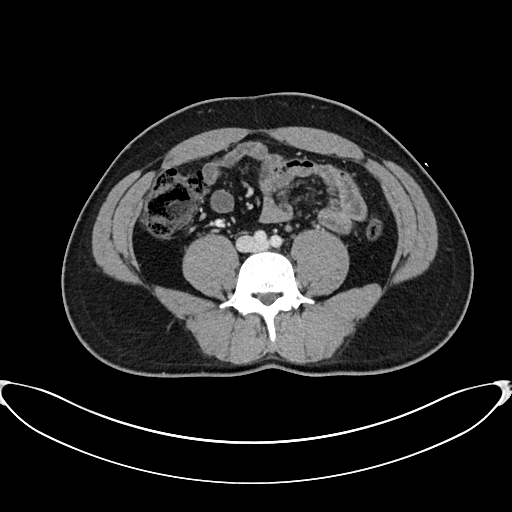
[im 51/92  soft-tissue]
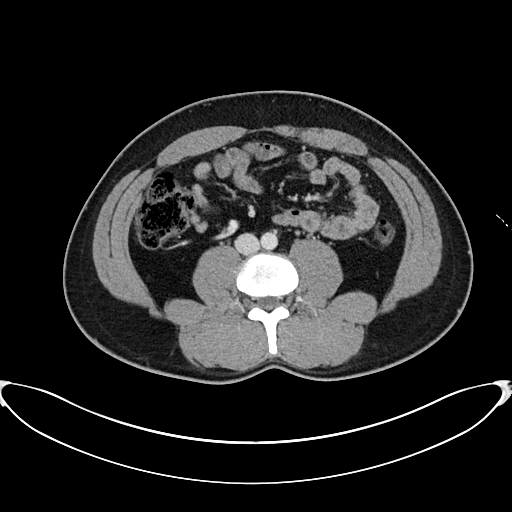
[im 60/92  soft-tissue]
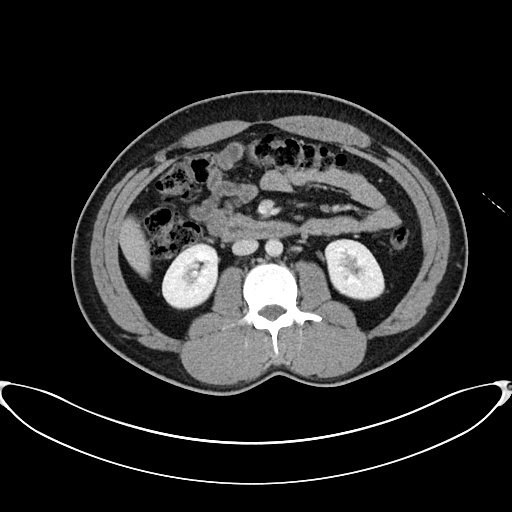
[im 60/92  bone]
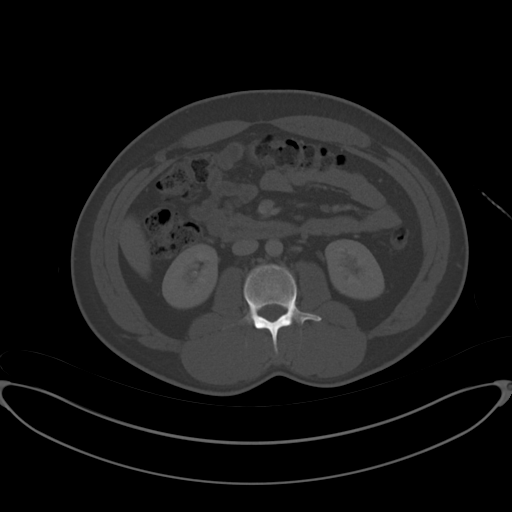
[im 64/92  soft-tissue]
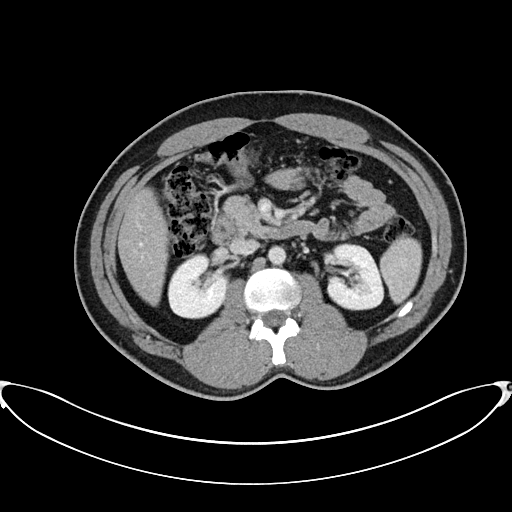
[im 73/92  soft-tissue]
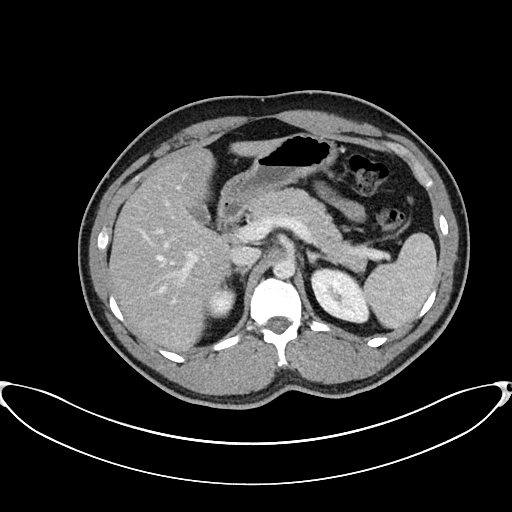
[im 78/92  soft-tissue]
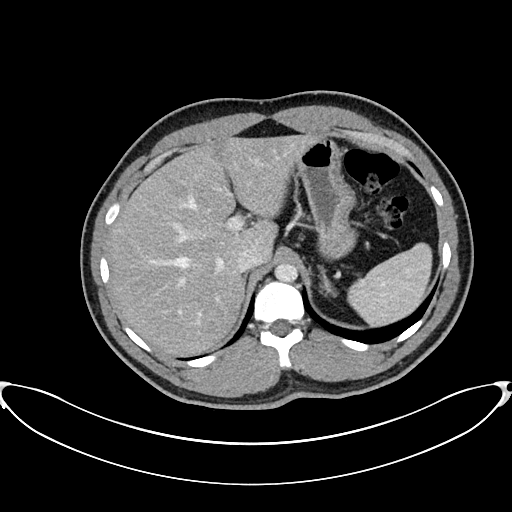
[im 87/92  soft-tissue]
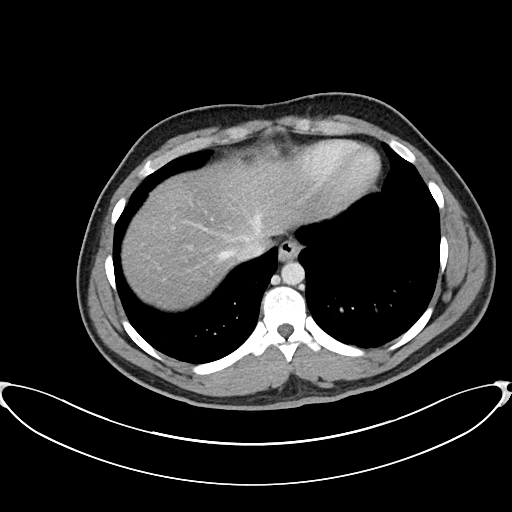

[Series 6: abdomen 3.0 mpr cor · coronal · 0.76mm/px · 3 of 90 slices shown]
[im 30/90  soft-tissue]
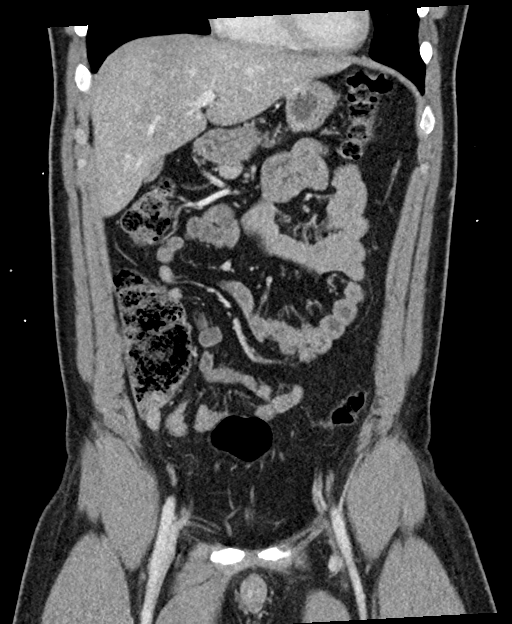
[im 40/90  soft-tissue]
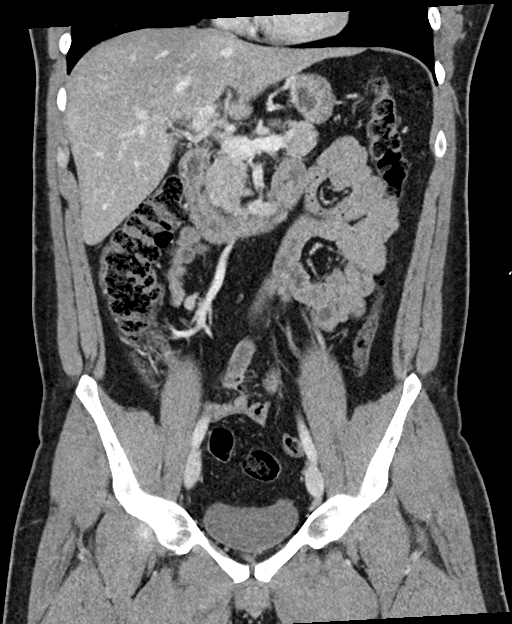
[im 50/90  soft-tissue]
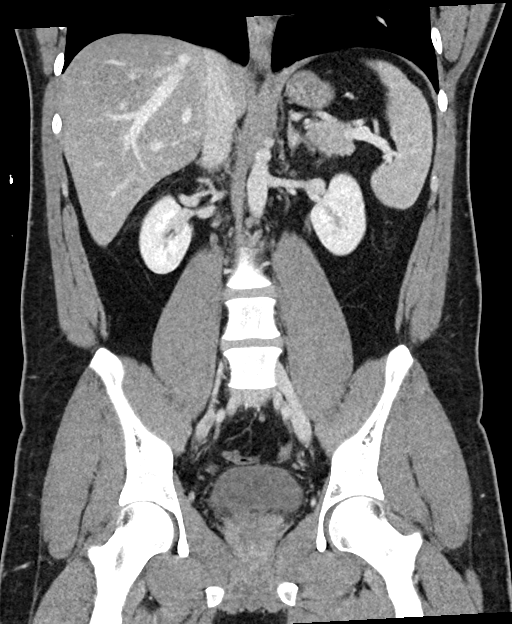

[16 of 46 positions shown; findings below may reference images not displayed]

RADIATION DOSE REDUCTION: This exam was performed according to the
departmental dose-optimization program which includes automated
exposure control, adjustment of the mA and/or kV according to
patient size and/or use of iterative reconstruction technique.

CONTRAST:  100mL OMNIPAQUE IOHEXOL 300 MG/ML  SOLN
FINDINGS: Lower chest: No acute abnormality.

Hepatobiliary: No focal liver abnormality is seen. No gallstones,
gallbladder wall thickening, or biliary dilatation.

Pancreas: Unremarkable. No pancreatic ductal dilatation or
surrounding inflammatory changes.

Spleen: Normal in size without focal abnormality.

Adrenals/Urinary Tract: Adrenal glands are within normal limits.
Kidneys demonstrate a normal enhancement pattern bilaterally. No
renal calculi or obstructive changes are seen. The ureters are
within normal limits. The bladder is decompressed.

Stomach/Bowel: Appendix is well visualized without inflammatory
changes to suggest appendicitis. Colon is within normal limits.
Stomach and small bowel are unremarkable.

Vascular/Lymphatic: No significant vascular findings are present. No
enlarged abdominal or pelvic lymph nodes.

Reproductive: Prostate is unremarkable.

Other: No abdominal wall hernia or abnormality. No abdominopelvic
ascites.

Musculoskeletal: No acute or significant osseous findings.
IMPRESSION: Normal-appearing appendix.

No other focal abnormality is noted.

## 2023-03-20 ENCOUNTER — Encounter: Payer: Self-pay | Admitting: *Deleted
